# Patient Record
Sex: Male | Born: 1965
Health system: Southern US, Community
[De-identification: ages and names within clinical notes are randomized; demographics above are authoritative.]

## PROBLEM LIST (undated history)

## (undated) DIAGNOSIS — W3400XA Accidental discharge from unspecified firearms or gun, initial encounter: Secondary | ICD-10-CM

## (undated) HISTORY — PX: NO PAST SURGERIES: SHX2092

---

## 2001-09-15 ENCOUNTER — Encounter: Payer: Self-pay | Admitting: Emergency Medicine

## 2001-09-15 ENCOUNTER — Emergency Department (HOSPITAL_COMMUNITY): Admission: EM | Admit: 2001-09-15 | Discharge: 2001-09-15 | Payer: Self-pay | Admitting: Emergency Medicine

## 2001-09-17 ENCOUNTER — Emergency Department (HOSPITAL_COMMUNITY): Admission: EM | Admit: 2001-09-17 | Discharge: 2001-09-17 | Payer: Self-pay | Admitting: Emergency Medicine

## 2001-10-29 ENCOUNTER — Emergency Department (HOSPITAL_COMMUNITY): Admission: EM | Admit: 2001-10-29 | Discharge: 2001-10-29 | Payer: Self-pay | Admitting: Internal Medicine

## 2001-10-29 ENCOUNTER — Encounter: Payer: Self-pay | Admitting: Internal Medicine

## 2011-02-12 ENCOUNTER — Other Ambulatory Visit: Payer: Self-pay

## 2011-02-12 ENCOUNTER — Observation Stay (HOSPITAL_COMMUNITY)
Admission: EM | Admit: 2011-02-12 | Discharge: 2011-02-13 | Payer: Self-pay | Attending: General Surgery | Admitting: General Surgery

## 2011-02-12 ENCOUNTER — Emergency Department (HOSPITAL_COMMUNITY): Payer: Self-pay

## 2011-02-12 ENCOUNTER — Encounter: Payer: Self-pay | Admitting: Emergency Medicine

## 2011-02-12 DIAGNOSIS — S21239A Puncture wound without foreign body of unspecified back wall of thorax without penetration into thoracic cavity, initial encounter: Secondary | ICD-10-CM

## 2011-02-12 DIAGNOSIS — S41012A Laceration without foreign body of left shoulder, initial encounter: Secondary | ICD-10-CM | POA: Diagnosis present

## 2011-02-12 DIAGNOSIS — S42101A Fracture of unspecified part of scapula, right shoulder, initial encounter for closed fracture: Secondary | ICD-10-CM | POA: Diagnosis present

## 2011-02-12 DIAGNOSIS — Y998 Other external cause status: Secondary | ICD-10-CM | POA: Insufficient documentation

## 2011-02-12 DIAGNOSIS — S21219A Laceration without foreign body of unspecified back wall of thorax without penetration into thoracic cavity, initial encounter: Secondary | ICD-10-CM

## 2011-02-12 DIAGNOSIS — S21209A Unspecified open wound of unspecified back wall of thorax without penetration into thoracic cavity, initial encounter: Secondary | ICD-10-CM | POA: Insufficient documentation

## 2011-02-12 DIAGNOSIS — S2190XA Unspecified open wound of unspecified part of thorax, initial encounter: Principal | ICD-10-CM | POA: Insufficient documentation

## 2011-02-12 DIAGNOSIS — S2232XA Fracture of one rib, left side, initial encounter for closed fracture: Secondary | ICD-10-CM | POA: Diagnosis present

## 2011-02-12 DIAGNOSIS — S42199B Fracture of other part of scapula, unspecified shoulder, initial encounter for open fracture: Secondary | ICD-10-CM | POA: Insufficient documentation

## 2011-02-12 DIAGNOSIS — S61412A Laceration without foreign body of left hand, initial encounter: Secondary | ICD-10-CM | POA: Diagnosis present

## 2011-02-12 DIAGNOSIS — S0180XA Unspecified open wound of other part of head, initial encounter: Secondary | ICD-10-CM | POA: Insufficient documentation

## 2011-02-12 DIAGNOSIS — F1010 Alcohol abuse, uncomplicated: Secondary | ICD-10-CM | POA: Diagnosis present

## 2011-02-12 DIAGNOSIS — F17200 Nicotine dependence, unspecified, uncomplicated: Secondary | ICD-10-CM | POA: Insufficient documentation

## 2011-02-12 DIAGNOSIS — S61409A Unspecified open wound of unspecified hand, initial encounter: Secondary | ICD-10-CM | POA: Insufficient documentation

## 2011-02-12 DIAGNOSIS — Z1811 Retained magnetic metal fragments: Secondary | ICD-10-CM | POA: Insufficient documentation

## 2011-02-12 DIAGNOSIS — S42102A Fracture of unspecified part of scapula, left shoulder, initial encounter for closed fracture: Secondary | ICD-10-CM

## 2011-02-12 DIAGNOSIS — W3400XA Accidental discharge from unspecified firearms or gun, initial encounter: Secondary | ICD-10-CM | POA: Insufficient documentation

## 2011-02-12 DIAGNOSIS — S42109A Fracture of unspecified part of scapula, unspecified shoulder, initial encounter for closed fracture: Secondary | ICD-10-CM

## 2011-02-12 DIAGNOSIS — S21109A Unspecified open wound of unspecified front wall of thorax without penetration into thoracic cavity, initial encounter: Secondary | ICD-10-CM

## 2011-02-12 DIAGNOSIS — S270XXA Traumatic pneumothorax, initial encounter: Secondary | ICD-10-CM

## 2011-02-12 DIAGNOSIS — S2239XB Fracture of one rib, unspecified side, initial encounter for open fracture: Secondary | ICD-10-CM | POA: Insufficient documentation

## 2011-02-12 DIAGNOSIS — S21119A Laceration without foreign body of unspecified front wall of thorax without penetration into thoracic cavity, initial encounter: Secondary | ICD-10-CM | POA: Diagnosis present

## 2011-02-12 HISTORY — DX: Accidental discharge from unspecified firearms or gun, initial encounter: W34.00XA

## 2011-02-12 LAB — POCT I-STAT, CHEM 8
BUN: 15 mg/dL (ref 6–23)
Calcium, Ion: 1.11 mmol/L — ABNORMAL LOW (ref 1.12–1.32)
Chloride: 104 mEq/L (ref 96–112)
Creatinine, Ser: 1.4 mg/dL — ABNORMAL HIGH (ref 0.50–1.35)
Glucose, Bld: 78 mg/dL (ref 70–99)
HCT: 44 % (ref 39.0–52.0)
Potassium: 3.4 mEq/L — ABNORMAL LOW (ref 3.5–5.1)

## 2011-02-12 LAB — COMPREHENSIVE METABOLIC PANEL
AST: 27 U/L (ref 0–37)
Albumin: 4.3 g/dL (ref 3.5–5.2)
BUN: 15 mg/dL (ref 6–23)
CO2: 19 mEq/L (ref 19–32)
Calcium: 9.7 mg/dL (ref 8.4–10.5)
Chloride: 96 mEq/L (ref 96–112)
Creatinine, Ser: 1.21 mg/dL (ref 0.50–1.35)
GFR calc non Af Amer: 71 mL/min — ABNORMAL LOW (ref >90)
Total Bilirubin: 0.3 mg/dL (ref 0.3–1.2)

## 2011-02-12 LAB — URINALYSIS, MICROSCOPIC ONLY
Glucose, UA: NEGATIVE mg/dL
Leukocytes, UA: NEGATIVE
Nitrite: NEGATIVE
Specific Gravity, Urine: 1.024 (ref 1.005–1.030)
pH: 5.5 (ref 5.0–8.0)

## 2011-02-12 LAB — LACTIC ACID, PLASMA: Lactic Acid, Venous: 8.6 mmol/L — ABNORMAL HIGH (ref 0.5–2.2)

## 2011-02-12 LAB — CBC
Hemoglobin: 13.9 g/dL (ref 13.0–17.0)
MCH: 33.9 pg (ref 26.0–34.0)
Platelets: 201 10*3/uL (ref 150–400)
RBC: 4.1 MIL/uL — ABNORMAL LOW (ref 4.22–5.81)
WBC: 10 10*3/uL (ref 4.0–10.5)

## 2011-02-12 MED ORDER — TETANUS-DIPHTH-ACELL PERTUSSIS 5-2.5-18.5 LF-MCG/0.5 IM SUSP
0.5000 mL | Freq: Once | INTRAMUSCULAR | Status: AC
  Administered 2011-02-12: 0.5 mL via INTRAMUSCULAR

## 2011-02-12 MED ORDER — MORPHINE SULFATE 2 MG/ML IJ SOLN: 2.0000 mg | INTRAMUSCULAR | Status: DC | PRN

## 2011-02-12 MED ORDER — HYDROCODONE-ACETAMINOPHEN 5-325 MG PO TABS
2.0000 | ORAL_TABLET | ORAL | Status: DC | PRN
  Administered 2011-02-12 – 2011-02-13 (×5): 2 via ORAL
  Filled 2011-02-12 (×5): qty 2

## 2011-02-12 MED ORDER — KCL IN DEXTROSE-NACL 10-5-0.45 MEQ/L-%-% IV SOLN
INTRAVENOUS | Status: DC
  Administered 2011-02-12: 20 mL/h via INTRAVENOUS
  Filled 2011-02-12 (×2): qty 1000

## 2011-02-12 MED ORDER — PANTOPRAZOLE SODIUM 40 MG IV SOLR
40.0000 mg | Freq: Every day | INTRAVENOUS | Status: DC
  Filled 2011-02-12: qty 40

## 2011-02-12 MED ORDER — CEFAZOLIN SODIUM 1-5 GM-% IV SOLN
1.0000 g | Freq: Three times a day (TID) | INTRAVENOUS | Status: DC
  Administered 2011-02-12 – 2011-02-13 (×3): 1 g via INTRAVENOUS
  Filled 2011-02-12 (×5): qty 50

## 2011-02-12 MED ORDER — DOCUSATE SODIUM 100 MG PO CAPS
100.0000 mg | ORAL_CAPSULE | Freq: Two times a day (BID) | ORAL | Status: DC
  Administered 2011-02-12 – 2011-02-13 (×2): 100 mg via ORAL
  Filled 2011-02-12 (×2): qty 1

## 2011-02-12 MED ORDER — PANTOPRAZOLE SODIUM 40 MG PO TBEC
40.0000 mg | DELAYED_RELEASE_TABLET | Freq: Every day | ORAL | Status: DC
  Administered 2011-02-12: 40 mg via ORAL

## 2011-02-12 MED ORDER — IOHEXOL 300 MG/ML  SOLN
100.0000 mL | Freq: Once | INTRAMUSCULAR | Status: AC | PRN
  Administered 2011-02-12: 100 mL via INTRAVENOUS

## 2011-02-12 MED ORDER — ONDANSETRON HCL 4 MG/2ML IJ SOLN: 4.0000 mg | Freq: Four times a day (QID) | INTRAMUSCULAR | Status: DC | PRN

## 2011-02-12 MED ORDER — ENOXAPARIN SODIUM 40 MG/0.4ML ~~LOC~~ SOLN
40.0000 mg | Freq: Every day | SUBCUTANEOUS | Status: DC
  Administered 2011-02-12: 40 mg via SUBCUTANEOUS
  Filled 2011-02-12 (×2): qty 0.4

## 2011-02-12 MED ORDER — ONDANSETRON HCL 4 MG PO TABS: 4.0000 mg | ORAL_TABLET | Freq: Four times a day (QID) | ORAL | Status: DC | PRN
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
## 2011-02-12 NOTE — ED Notes (Signed)
Vital signs stable. 

## 2011-02-12 NOTE — ED Notes (Signed)
Pt to ct scan with RN monitored
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
## 2011-02-12 NOTE — ED Notes (Signed)
See trauma

## 2011-02-12 NOTE — ED Notes (Signed)
Patient transported to CT 
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
## 2011-02-13 ENCOUNTER — Inpatient Hospital Stay (HOSPITAL_COMMUNITY): Payer: Self-pay

## 2011-02-13 DIAGNOSIS — S21239A Puncture wound without foreign body of unspecified back wall of thorax without penetration into thoracic cavity, initial encounter: Secondary | ICD-10-CM

## 2011-02-13 DIAGNOSIS — S42101A Fracture of unspecified part of scapula, right shoulder, initial encounter for closed fracture: Secondary | ICD-10-CM | POA: Diagnosis present

## 2011-02-13 DIAGNOSIS — S41012A Laceration without foreign body of left shoulder, initial encounter: Secondary | ICD-10-CM | POA: Diagnosis present

## 2011-02-13 DIAGNOSIS — Z7289 Other problems related to lifestyle: Secondary | ICD-10-CM | POA: Insufficient documentation

## 2011-02-13 DIAGNOSIS — S61412A Laceration without foreign body of left hand, initial encounter: Secondary | ICD-10-CM | POA: Diagnosis present

## 2011-02-13 DIAGNOSIS — S21119A Laceration without foreign body of unspecified front wall of thorax without penetration into thoracic cavity, initial encounter: Secondary | ICD-10-CM | POA: Diagnosis present

## 2011-02-13 LAB — TYPE AND SCREEN
ABO/RH(D): A POS
Antibody Screen: NEGATIVE
Unit division: 0
Unit division: 0

## 2011-02-13 LAB — CBC
HCT: 39 % (ref 39.0–52.0)
MCH: 33.2 pg (ref 26.0–34.0)
MCHC: 34.9 g/dL (ref 30.0–36.0)
MCV: 95.1 fL (ref 78.0–100.0)
RDW: 11.9 % (ref 11.5–15.5)

## 2011-02-13 LAB — SAMPLE TO BLOOD BANK

## 2011-02-13 LAB — BASIC METABOLIC PANEL
BUN: 13 mg/dL (ref 6–23)
Creatinine, Ser: 1.02 mg/dL (ref 0.50–1.35)
GFR calc Af Amer: >90 mL/min (ref >90)
GFR calc non Af Amer: 88 mL/min — ABNORMAL LOW (ref >90)

## 2011-02-13 MED ORDER — HYDROCODONE-ACETAMINOPHEN 5-325 MG PO TABS: 2.0000 | ORAL_TABLET | ORAL | Status: AC | PRN
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
## 2011-02-13 NOTE — Progress Notes (Signed)
Feeling well Patient examined and I agree with the assessment and plan  Dajanae Brophy E
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
## 2011-02-14 NOTE — Discharge Summary (Signed)
Agree Oscar Jackson  

## 2012-12-18 ENCOUNTER — Encounter (HOSPITAL_COMMUNITY): Payer: Self-pay | Admitting: Emergency Medicine

## 2012-12-18 ENCOUNTER — Emergency Department (HOSPITAL_COMMUNITY)
Admission: EM | Admit: 2012-12-18 | Discharge: 2012-12-18 | Disposition: A | Discharge status: Home / Self Care | Payer: Self-pay | Attending: Emergency Medicine | Admitting: Emergency Medicine

## 2012-12-18 ENCOUNTER — Emergency Department (HOSPITAL_COMMUNITY): Payer: Self-pay

## 2012-12-18 DIAGNOSIS — S022XXA Fracture of nasal bones, initial encounter for closed fracture: Secondary | ICD-10-CM

## 2012-12-18 DIAGNOSIS — F17200 Nicotine dependence, unspecified, uncomplicated: Secondary | ICD-10-CM | POA: Insufficient documentation

## 2012-12-18 DIAGNOSIS — S0003XA Contusion of scalp, initial encounter: Secondary | ICD-10-CM | POA: Insufficient documentation

## 2012-12-18 DIAGNOSIS — S0083XA Contusion of other part of head, initial encounter: Secondary | ICD-10-CM

## 2012-12-18 DIAGNOSIS — S032XXA Dislocation of tooth, initial encounter: Secondary | ICD-10-CM

## 2012-12-18 DIAGNOSIS — S025XXA Fracture of tooth (traumatic), initial encounter for closed fracture: Secondary | ICD-10-CM | POA: Insufficient documentation

## 2012-12-18 MED ORDER — HYDROCODONE-ACETAMINOPHEN 5-325 MG PO TABS: 2.0000 | ORAL_TABLET | Freq: Four times a day (QID) | ORAL | Status: DC | PRN
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
## 2013-11-07 ENCOUNTER — Emergency Department (HOSPITAL_COMMUNITY): Payer: Self-pay

## 2013-11-07 ENCOUNTER — Emergency Department (HOSPITAL_COMMUNITY)
Admission: EM | Admit: 2013-11-07 | Discharge: 2013-11-07 | Disposition: A | Discharge status: Home / Self Care | Payer: Self-pay | Attending: Emergency Medicine | Admitting: Emergency Medicine

## 2013-11-07 ENCOUNTER — Encounter (HOSPITAL_COMMUNITY): Payer: Self-pay | Admitting: Emergency Medicine

## 2013-11-07 DIAGNOSIS — Y9289 Other specified places as the place of occurrence of the external cause: Secondary | ICD-10-CM | POA: Insufficient documentation

## 2013-11-07 DIAGNOSIS — S62639B Displaced fracture of distal phalanx of unspecified finger, initial encounter for open fracture: Secondary | ICD-10-CM | POA: Insufficient documentation

## 2013-11-07 DIAGNOSIS — Y9389 Activity, other specified: Secondary | ICD-10-CM | POA: Insufficient documentation

## 2013-11-07 DIAGNOSIS — Z792 Long term (current) use of antibiotics: Secondary | ICD-10-CM | POA: Insufficient documentation

## 2013-11-07 DIAGNOSIS — Z87828 Personal history of other (healed) physical injury and trauma: Secondary | ICD-10-CM | POA: Insufficient documentation

## 2013-11-07 DIAGNOSIS — S6980XA Other specified injuries of unspecified wrist, hand and finger(s), initial encounter: Secondary | ICD-10-CM | POA: Insufficient documentation

## 2013-11-07 DIAGNOSIS — W208XXA Other cause of strike by thrown, projected or falling object, initial encounter: Secondary | ICD-10-CM | POA: Insufficient documentation

## 2013-11-07 DIAGNOSIS — F17200 Nicotine dependence, unspecified, uncomplicated: Secondary | ICD-10-CM | POA: Insufficient documentation

## 2013-11-07 DIAGNOSIS — S62635B Displaced fracture of distal phalanx of left ring finger, initial encounter for open fracture: Secondary | ICD-10-CM

## 2013-11-07 DIAGNOSIS — IMO0001 Reserved for inherently not codable concepts without codable children: Secondary | ICD-10-CM

## 2013-11-07 DIAGNOSIS — S62639A Displaced fracture of distal phalanx of unspecified finger, initial encounter for closed fracture: Secondary | ICD-10-CM | POA: Insufficient documentation

## 2013-11-07 DIAGNOSIS — S62637A Displaced fracture of distal phalanx of left little finger, initial encounter for closed fracture: Secondary | ICD-10-CM

## 2013-11-07 DIAGNOSIS — S6990XA Unspecified injury of unspecified wrist, hand and finger(s), initial encounter: Secondary | ICD-10-CM | POA: Insufficient documentation

## 2013-11-07 MED ORDER — CEFAZOLIN SODIUM-DEXTROSE 2-3 GM-% IV SOLR
2.0000 g | Freq: Once | INTRAVENOUS | Status: AC
  Administered 2013-11-07: 2 g via INTRAVENOUS
  Filled 2013-11-07: qty 50

## 2013-11-07 MED ORDER — CEPHALEXIN 500 MG PO CAPS: 500.0000 mg | ORAL_CAPSULE | Freq: Four times a day (QID) | ORAL | Status: DC

## 2013-11-07 MED ORDER — LIDOCAINE HCL (PF) 1 % IJ SOLN
INTRAMUSCULAR | Status: AC
  Filled 2013-11-07: qty 5

## 2013-11-07 MED ORDER — OXYCODONE-ACETAMINOPHEN 5-325 MG PO TABS: 1.0000 | ORAL_TABLET | Freq: Four times a day (QID) | ORAL | Status: DC | PRN

## 2013-11-07 MED ORDER — LIDOCAINE HCL (CARDIAC) 20 MG/ML IV SOLN
INTRAVENOUS | Status: AC
  Filled 2013-11-07: qty 5

## 2013-11-07 MED ORDER — OXYCODONE-ACETAMINOPHEN 5-325 MG PO TABS
1.0000 | ORAL_TABLET | Freq: Once | ORAL | Status: AC
  Administered 2013-11-07: 1 via ORAL
  Filled 2013-11-07: qty 1

## 2013-11-07 MED ORDER — LIDOCAINE HCL (PF) 2 % IJ SOLN
10.0000 mL | INTRAMUSCULAR | Status: AC
  Administered 2013-11-07: 10 mL
  Filled 2013-11-07 (×2): qty 10

## 2013-11-07 MED ORDER — BUPIVACAINE HCL (PF) 0.5 % IJ SOLN
10.0000 mL | INTRAMUSCULAR | Status: AC
  Administered 2013-11-07: 10 mL
  Filled 2013-11-07 (×2): qty 10

## 2013-11-07 MED ORDER — OXYCODONE-ACETAMINOPHEN 5-325 MG PO TABS
2.0000 | ORAL_TABLET | Freq: Once | ORAL | Status: AC
  Administered 2013-11-07: 2 via ORAL
  Filled 2013-11-07: qty 2

## 2013-11-07 NOTE — Discharge Instructions (Signed)
Take percocet for severe pain only. No driving or operating heavy machinery while taking percocet. This medication may cause drowsiness. Take antibiotic to completion. Follow up with the hand surgeon Dr. Amanda Pea in 1 week.  Finger Fracture Fractures of fingers are breaks in the bones of the fingers. There are many types of fractures. There are different ways of treating these fractures. Your health care provider will discuss the best way to treat your fracture. CAUSES Traumatic injury is the main cause of broken fingers. These include:  Injuries while playing sports.  Workplace injuries.  Falls. RISK FACTORS Activities that can increase your risk of finger fractures include:  Sports.  Workplace activities that involve machinery.  A condition called osteoporosis, which can make your bones less dense and cause them to fracture more easily. SIGNS AND SYMPTOMS The main symptoms of a broken finger are pain and swelling within 15 minutes after the injury. Other symptoms include:  Bruising of your finger.  Stiffness of your finger.  Numbness of your finger.  Exposed bones (compound fracture) if the fracture is severe. DIAGNOSIS  The best way to diagnose a broken bone is with X-ray imaging. Additionally, your health care provider will use this X-ray image to evaluate the position of the broken finger bones.  TREATMENT  Finger fractures can be treated with:   Nonreduction--This means the bones are in place. The finger is splinted without changing the positions of the bone pieces. The splint is usually left on for about a week to 10 days. This will depend on your fracture and what your health care provider thinks.  Closed reduction--The bones are put back into position without using surgery. The finger is then splinted.  Open reduction and internal fixation--The fracture site is opened. Then the bone pieces are fixed into place with pins or some type of hardware. This is seldom  required. It depends on the severity of the fracture. HOME CARE INSTRUCTIONS   Follow your health care provider's instructions regarding activities, exercises, and physical therapy.  Only take over-the-counter or prescription medicines for pain, discomfort, or fever as directed by your health care provider. SEEK MEDICAL CARE IF: You have pain or swelling that limits the motion or use of your fingers. SEEK IMMEDIATE MEDICAL CARE IF:  Your finger becomes numb. MAKE SURE YOU:   Understand these instructions.  Will watch your condition.  Will get help right away if you are not doing well or get worse. Document Released: 05/24/2000 Document Revised: 11/30/2012 Document Reviewed: 09/21/2012 [facility] Patient Information 2015 [place], [place]. This information is not intended to replace advice given to you by your health care provider. Make sure you discuss any questions you have with your health care provider.
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
## 2013-11-07 NOTE — ED Notes (Signed)
Pt states he is unable to afford medicine-- case management notified

## 2013-11-07 NOTE — Progress Notes (Signed)
  CARE MANAGEMENT ED NOTE 11/07/2013  Patient:  Oscar Jackson, Oscar Jackson   Account Number:  [id number]  Date Initiated:  11/07/2013  Documentation initiated by:  Ferdinand Cava  Subjective/Objective Assessment:   48 yo male presenting to the ED with laceration on hand     Subjective/Objective Assessment Detail:     Action/Plan:   The patient will follow up at either [facility] or [facility] ( provided information) and will fill prescriptions at [facility]   Action/Plan Detail:   Anticipated DC Date:       Status Recommendation to Physician:   Result of Recommendation:  Agreed    DC Planning Services  CM consult  PCP issues    Choice offered to / List presented to:  C-1 Patient          Status of service:  Completed, signed off  ED Comments:   ED Comments Detail:  CM consulted for medication assistance. This CM spoke with the patient regarding current ED visit. The patient stated that he does not have any insurance. He stated that he has been using the [facility] resources but he has just recently moved into a group home. This CM provided the patient with a pamphlet for the [facility] and explained that since he has housing he may not be eligible for [facility] services and the patient verbalized understanding. This CM discussed the $4 medication list at [facility]. The patient stated that he has no money today but he can afford the medications tomorrow. This CM spoke with the ED PA Robyn and she stated that she would prescribe medications from the [facility] $4 list for affordability. This Cm spoke with the patient and informed him of the cost of the discharge medications. The patient verbalized understanding and had no other questions or concerns.
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
## 2013-11-11 NOTE — ED Provider Notes (Signed)
Medical screening examination/treatment/procedure(s) were conducted as a shared visit with non-physician practitioner(s) and myself.  I personally evaluated the patient during the encounter.   EKG Interpretation None       Patient with finger fractures and lacerations. Repaired by hand, will d/c with pain meds and abx.  Audree Camel, MD 11/11/13 [phone]

## 2014-11-22 ENCOUNTER — Encounter (HOSPITAL_COMMUNITY): Payer: Self-pay | Admitting: Emergency Medicine

## 2014-11-22 ENCOUNTER — Emergency Department (HOSPITAL_COMMUNITY)
Admission: EM | Admit: 2014-11-22 | Discharge: 2014-11-22 | Disposition: A | Discharge status: Home / Self Care | Payer: No Typology Code available for payment source | Attending: Emergency Medicine | Admitting: Emergency Medicine

## 2014-11-22 DIAGNOSIS — S199XXA Unspecified injury of neck, initial encounter: Secondary | ICD-10-CM | POA: Diagnosis present

## 2014-11-22 DIAGNOSIS — Z720 Tobacco use: Secondary | ICD-10-CM | POA: Diagnosis not present

## 2014-11-22 DIAGNOSIS — Y998 Other external cause status: Secondary | ICD-10-CM | POA: Insufficient documentation

## 2014-11-22 DIAGNOSIS — Y9389 Activity, other specified: Secondary | ICD-10-CM | POA: Insufficient documentation

## 2014-11-22 DIAGNOSIS — Y92410 Unspecified street and highway as the place of occurrence of the external cause: Secondary | ICD-10-CM | POA: Insufficient documentation

## 2014-11-22 DIAGNOSIS — Z792 Long term (current) use of antibiotics: Secondary | ICD-10-CM | POA: Insufficient documentation

## 2014-11-22 DIAGNOSIS — G629 Polyneuropathy, unspecified: Secondary | ICD-10-CM | POA: Insufficient documentation

## 2014-11-22 DIAGNOSIS — M542 Cervicalgia: Secondary | ICD-10-CM

## 2014-11-22 MED ORDER — CYCLOBENZAPRINE HCL 10 MG PO TABS: 10.0000 mg | ORAL_TABLET | Freq: Two times a day (BID) | ORAL | Status: DC | PRN
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
## 2014-11-22 NOTE — Discharge Instructions (Signed)
Please monitor for new or worsening signs or symptoms, return immediately if any present. Please use medication as directed.

## 2014-11-22 NOTE — ED Notes (Signed)
Pt acknowledges taking all belongings home. 

## 2014-12-06 ENCOUNTER — Emergency Department (HOSPITAL_COMMUNITY)
Admission: EM | Admit: 2014-12-06 | Discharge: 2014-12-06 | Disposition: A | Discharge status: Home / Self Care | Payer: No Typology Code available for payment source | Attending: Emergency Medicine | Admitting: Emergency Medicine

## 2014-12-06 ENCOUNTER — Emergency Department (HOSPITAL_COMMUNITY): Payer: No Typology Code available for payment source

## 2014-12-06 ENCOUNTER — Encounter (HOSPITAL_COMMUNITY): Payer: Self-pay | Admitting: Emergency Medicine

## 2014-12-06 DIAGNOSIS — S4992XA Unspecified injury of left shoulder and upper arm, initial encounter: Secondary | ICD-10-CM | POA: Insufficient documentation

## 2014-12-06 DIAGNOSIS — M25512 Pain in left shoulder: Secondary | ICD-10-CM

## 2014-12-06 DIAGNOSIS — Z792 Long term (current) use of antibiotics: Secondary | ICD-10-CM | POA: Diagnosis not present

## 2014-12-06 DIAGNOSIS — Y92410 Unspecified street and highway as the place of occurrence of the external cause: Secondary | ICD-10-CM | POA: Diagnosis not present

## 2014-12-06 DIAGNOSIS — Z720 Tobacco use: Secondary | ICD-10-CM | POA: Insufficient documentation

## 2014-12-06 DIAGNOSIS — Y9389 Activity, other specified: Secondary | ICD-10-CM | POA: Insufficient documentation

## 2014-12-06 DIAGNOSIS — Y998 Other external cause status: Secondary | ICD-10-CM | POA: Diagnosis not present

## 2014-12-06 MED ORDER — IBUPROFEN 600 MG PO TABS: 600.0000 mg | ORAL_TABLET | Freq: Four times a day (QID) | ORAL | Status: DC | PRN

## 2014-12-06 MED ORDER — KETOROLAC TROMETHAMINE 60 MG/2ML IM SOLN
60.0000 mg | Freq: Once | INTRAMUSCULAR | Status: AC
  Administered 2014-12-06: 60 mg via INTRAMUSCULAR
  Filled 2014-12-06: qty 2
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
## 2014-12-06 NOTE — Discharge Instructions (Signed)
You were evaluated for your left shoulder pain. There is not appear to be an emergent cause for your symptoms at this time. Your x-ray was negative for any broken bones. Your exam is reassuring. Please take your medications as prescribed. Follow-up with your doctor for reevaluation as needed. Return to ED for worsening symptoms  Musculoskeletal Pain Musculoskeletal pain is muscle and boney aches and pains. These pains can occur in any part of the body. Your caregiver may treat you without knowing the cause of the pain. They may treat you if blood or urine tests, X-rays, and other tests were normal.  CAUSES There is often not a definite cause or reason for these pains. These pains may be caused by a type of germ (virus). The discomfort may also come from overuse. Overuse includes working out too hard when your body is not fit. Boney aches also come from weather changes. Bone is sensitive to atmospheric pressure changes. HOME CARE INSTRUCTIONS   Ask when your test results will be ready. Make sure you get your test results.  Only take over-the-counter or prescription medicines for pain, discomfort, or fever as directed by your caregiver. If you were given medications for your condition, do not drive, operate machinery or power tools, or sign legal documents for 24 hours. Do not drink alcohol. Do not take sleeping pills or other medications that may interfere with treatment.  Continue all activities unless the activities cause more pain. When the pain lessens, slowly resume normal activities. Gradually increase the intensity and duration of the activities or exercise.  During periods of severe pain, bed rest may be helpful. Lay or sit in any position that is comfortable.  Putting ice on the injured area.  Put ice in a bag.  Place a towel between your skin and the bag.  Leave the ice on for 15 to 20 minutes, 3 to 4 times a day.  Follow up with your caregiver for continued problems and no reason  can be found for the pain. If the pain becomes worse or does not go away, it may be necessary to repeat tests or do additional testing. Your caregiver may need to look further for a possible cause. SEEK IMMEDIATE MEDICAL CARE IF:  You have pain that is getting worse and is not relieved by medications.  You develop chest pain that is associated with shortness or breath, sweating, feeling sick to your stomach (nauseous), or throw up (vomit).  Your pain becomes localized to the abdomen.  You develop any new symptoms that seem different or that concern you. MAKE SURE YOU:   Understand these instructions.  Will watch your condition.  Will get help right away if you are not doing well or get worse.   This information is not intended to replace advice given to you by your health care provider. Make sure you discuss any questions you have with your health care provider.   Document Released: 02/09/2005 Document Revised: 05/04/2011 Document Reviewed: 10/14/2012 Elsevier Interactive Patient Education 2016 [employer]  Shoulder Pain The shoulder is the joint that connects your arms to your body. The bones that form the shoulder joint include the upper arm bone (humerus), the shoulder blade (scapula), and the collarbone (clavicle). The top of the humerus is shaped like a ball and fits into a rather flat socket on the scapula (glenoid cavity). A combination of muscles and strong, fibrous tissues that connect muscles to bones (tendons) support your shoulder joint and hold the ball in the  socket. Small, fluid-filled sacs (bursae) are located in different areas of the joint. They act as cushions between the bones and the overlying soft tissues and help reduce friction between the gliding tendons and the bone as you move your arm. Your shoulder joint allows a wide range of motion in your arm. This range of motion allows you to do things like scratch your back or throw a ball. However, this range of motion  also makes your shoulder more prone to pain from overuse and injury. Causes of shoulder pain can originate from both injury and overuse and usually can be grouped in the following four categories:  Redness, swelling, and pain (inflammation) of the tendon (tendinitis) or the bursae (bursitis).  Instability, such as a dislocation of the joint.  Inflammation of the joint (arthritis).  Broken bone (fracture). HOME CARE INSTRUCTIONS   Apply ice to the sore area.  Put ice in a plastic bag.  Place a towel between your skin and the bag.  Leave the ice on for 15-20 minutes, 3-4 times per day for the first 2 days, or as directed by your health care provider.  Stop using cold packs if they do not help with the pain.  If you have a shoulder sling or immobilizer, wear it as long as your caregiver instructs. Only remove it to shower or bathe. Move your arm as little as possible, but keep your hand moving to prevent swelling.  Squeeze a soft ball or foam pad as much as possible to help prevent swelling.  Only take over-the-counter or prescription medicines for pain, discomfort, or fever as directed by your caregiver. SEEK MEDICAL CARE IF:   Your shoulder pain increases, or new pain develops in your arm, hand, or fingers.  Your hand or fingers become cold and numb.  Your pain is not relieved with medicines. SEEK IMMEDIATE MEDICAL CARE IF:   Your arm, hand, or fingers are numb or tingling.  Your arm, hand, or fingers are significantly swollen or turn white or blue. MAKE SURE YOU:   Understand these instructions.  Will watch your condition.  Will get help right away if you are not doing well or get worse.   This information is not intended to replace advice given to you by your health care provider. Make sure you discuss any questions you have with your health care provider.   Document Released: 11/19/2004 Document Revised: 03/02/2014 Document Reviewed: 06/04/2014 Elsevier Interactive  Patient Education [employer]

## 2014-12-06 NOTE — ED Notes (Signed)
Declined W/C at D/C and was escorted to lobby by RN. 

## 2014-12-22 ENCOUNTER — Emergency Department (HOSPITAL_COMMUNITY): Payer: Self-pay

## 2014-12-22 ENCOUNTER — Emergency Department (HOSPITAL_COMMUNITY)
Admission: EM | Admit: 2014-12-22 | Discharge: 2014-12-22 | Disposition: A | Discharge status: Home / Self Care | Payer: Self-pay | Attending: Emergency Medicine | Admitting: Emergency Medicine

## 2014-12-22 ENCOUNTER — Encounter (HOSPITAL_COMMUNITY): Payer: Self-pay

## 2014-12-22 DIAGNOSIS — S0083XA Contusion of other part of head, initial encounter: Secondary | ICD-10-CM | POA: Insufficient documentation

## 2014-12-22 DIAGNOSIS — S025XXA Fracture of tooth (traumatic), initial encounter for closed fracture: Secondary | ICD-10-CM | POA: Insufficient documentation

## 2014-12-22 DIAGNOSIS — Y998 Other external cause status: Secondary | ICD-10-CM | POA: Insufficient documentation

## 2014-12-22 DIAGNOSIS — Z720 Tobacco use: Secondary | ICD-10-CM | POA: Insufficient documentation

## 2014-12-22 DIAGNOSIS — Y9389 Activity, other specified: Secondary | ICD-10-CM | POA: Insufficient documentation

## 2014-12-22 DIAGNOSIS — S0031XA Abrasion of nose, initial encounter: Secondary | ICD-10-CM | POA: Insufficient documentation

## 2014-12-22 DIAGNOSIS — S0990XA Unspecified injury of head, initial encounter: Secondary | ICD-10-CM | POA: Insufficient documentation

## 2014-12-22 DIAGNOSIS — Y92410 Unspecified street and highway as the place of occurrence of the external cause: Secondary | ICD-10-CM | POA: Insufficient documentation

## 2014-12-22 DIAGNOSIS — S59902A Unspecified injury of left elbow, initial encounter: Secondary | ICD-10-CM | POA: Insufficient documentation

## 2014-12-22 MED ORDER — HYDROMORPHONE HCL 1 MG/ML IJ SOLN
1.0000 mg | Freq: Once | INTRAMUSCULAR | Status: DC
  Filled 2014-12-22: qty 1

## 2014-12-22 MED ORDER — HYDROCODONE-ACETAMINOPHEN 5-325 MG PO TABS
1.0000 | ORAL_TABLET | Freq: Once | ORAL | Status: AC
  Administered 2014-12-22: 1 via ORAL
  Filled 2014-12-22: qty 1

## 2014-12-22 MED ORDER — HYDROCODONE-ACETAMINOPHEN 5-325 MG PO TABS
1.0000 | ORAL_TABLET | Freq: Four times a day (QID) | ORAL | Status: DC | PRN
Start: 2014-12-22 — End: 2019-05-11
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
## 2014-12-22 NOTE — ED Notes (Signed)
Pt called wife, family updated as to patient's status.

## 2014-12-22 NOTE — ED Notes (Signed)
Pt sitting at end of bed, family at bedside adamant that he is going home.  Dr. Jeraldine LootsLockwood notified.

## 2014-12-22 NOTE — ED Notes (Signed)
Pt leaving AMA, family at bedside, Dr Jeraldine LootsLockwood aware

## 2014-12-24 ENCOUNTER — Encounter (HOSPITAL_COMMUNITY): Payer: Self-pay | Admitting: Emergency Medicine

## 2014-12-26 ENCOUNTER — Encounter (HOSPITAL_COMMUNITY): Payer: Self-pay | Admitting: Emergency Medicine

## 2014-12-26 ENCOUNTER — Emergency Department (HOSPITAL_COMMUNITY): Payer: Self-pay

## 2014-12-26 ENCOUNTER — Emergency Department (HOSPITAL_COMMUNITY)
Admission: EM | Admit: 2014-12-26 | Discharge: 2014-12-26 | Disposition: A | Discharge status: Home / Self Care | Payer: Self-pay | Attending: Emergency Medicine | Admitting: Emergency Medicine

## 2014-12-26 DIAGNOSIS — S20312D Abrasion of left front wall of thorax, subsequent encounter: Secondary | ICD-10-CM | POA: Insufficient documentation

## 2014-12-26 DIAGNOSIS — T07XXXA Unspecified multiple injuries, initial encounter: Secondary | ICD-10-CM

## 2014-12-26 DIAGNOSIS — Z792 Long term (current) use of antibiotics: Secondary | ICD-10-CM | POA: Insufficient documentation

## 2014-12-26 DIAGNOSIS — S4292XD Fracture of left shoulder girdle, part unspecified, subsequent encounter for fracture with routine healing: Secondary | ICD-10-CM

## 2014-12-26 DIAGNOSIS — S42102D Fracture of unspecified part of scapula, left shoulder, subsequent encounter for fracture with routine healing: Secondary | ICD-10-CM | POA: Insufficient documentation

## 2014-12-26 DIAGNOSIS — S0081XD Abrasion of other part of head, subsequent encounter: Secondary | ICD-10-CM | POA: Insufficient documentation

## 2014-12-26 DIAGNOSIS — S50319D Abrasion of unspecified elbow, subsequent encounter: Secondary | ICD-10-CM | POA: Insufficient documentation

## 2014-12-26 DIAGNOSIS — Z720 Tobacco use: Secondary | ICD-10-CM | POA: Insufficient documentation

## 2014-12-26 MED ORDER — IBUPROFEN 800 MG PO TABS
800.0000 mg | ORAL_TABLET | Freq: Once | ORAL | Status: AC
  Administered 2014-12-26: 800 mg via ORAL
  Filled 2014-12-26: qty 1

## 2014-12-26 MED ORDER — OXYCODONE-ACETAMINOPHEN 5-325 MG PO TABS
1.0000 | ORAL_TABLET | Freq: Once | ORAL | Status: AC
  Administered 2014-12-26: 1 via ORAL
  Filled 2014-12-26: qty 1

## 2014-12-26 MED ORDER — DIAZEPAM 5 MG PO TABS
5.0000 mg | ORAL_TABLET | Freq: Once | ORAL | Status: AC
Start: 2014-12-26 — End: 2014-12-26
  Administered 2014-12-26: 5 mg via ORAL
  Filled 2014-12-26: qty 1

## 2014-12-26 MED ORDER — IBUPROFEN 600 MG PO TABS: 600.0000 mg | ORAL_TABLET | Freq: Four times a day (QID) | ORAL | Status: DC | PRN

## 2014-12-26 MED ORDER — HYDROCODONE-ACETAMINOPHEN 5-325 MG PO TABS: 2.0000 | ORAL_TABLET | ORAL | Status: DC | PRN

## 2014-12-26 NOTE — Discharge Instructions (Signed)
Follow up with orthopedic specialist for evaluation of shoulder fracture. Follow up with opthalmology for evaluation of blurry vision. Keep arm in sling. Apply ice to affected area. Take ibuprofen as needed for pain and inflammation. Be cautious of signs of infections around your skin abrasions. Return to the Emergency department if you experience numbness or tingling in your arm, worsening of your pain, fever, redness or swelling around skin abrasions, vision changes.
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
## 2014-12-26 NOTE — ED Notes (Signed)
Pt upset at discharge regarding d/c pain meds.  Discussed with patient at length with the PA regarding ibuprofen for pain with the vicodin for severe pain not controlled.  Pt upset with amount of vicodin given.  Pt discharged home.
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
## 2015-03-24 ENCOUNTER — Emergency Department (HOSPITAL_COMMUNITY)
Admission: EM | Admit: 2015-03-24 | Discharge: 2015-03-24 | Disposition: A | Discharge status: Home / Self Care | Payer: Self-pay | Attending: Emergency Medicine | Admitting: Emergency Medicine

## 2015-03-24 ENCOUNTER — Encounter (HOSPITAL_COMMUNITY): Payer: Self-pay | Admitting: Family Medicine

## 2015-03-24 DIAGNOSIS — F17210 Nicotine dependence, cigarettes, uncomplicated: Secondary | ICD-10-CM | POA: Insufficient documentation

## 2015-03-24 DIAGNOSIS — Y998 Other external cause status: Secondary | ICD-10-CM | POA: Insufficient documentation

## 2015-03-24 DIAGNOSIS — S0990XA Unspecified injury of head, initial encounter: Secondary | ICD-10-CM

## 2015-03-24 DIAGNOSIS — Y9289 Other specified places as the place of occurrence of the external cause: Secondary | ICD-10-CM | POA: Insufficient documentation

## 2015-03-24 DIAGNOSIS — S0103XA Puncture wound without foreign body of scalp, initial encounter: Secondary | ICD-10-CM

## 2015-03-24 DIAGNOSIS — Y9389 Activity, other specified: Secondary | ICD-10-CM | POA: Insufficient documentation

## 2015-03-24 NOTE — Discharge Instructions (Signed)
Keep the areas clean and dry.  Return here as needed
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
## 2015-03-24 NOTE — ED Notes (Signed)
[employer] at bedside 
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
## 2019-05-11 ENCOUNTER — Encounter (HOSPITAL_COMMUNITY): Payer: Self-pay | Admitting: Emergency Medicine

## 2019-05-11 ENCOUNTER — Emergency Department (HOSPITAL_COMMUNITY)
Admission: EM | Admit: 2019-05-11 | Discharge: 2019-05-11 | Disposition: A | Discharge status: Home / Self Care | Payer: Self-pay | Attending: Emergency Medicine | Admitting: Emergency Medicine

## 2019-05-11 ENCOUNTER — Other Ambulatory Visit: Payer: Self-pay

## 2019-05-11 ENCOUNTER — Emergency Department (HOSPITAL_COMMUNITY): Payer: Self-pay

## 2019-05-11 DIAGNOSIS — W228XXA Striking against or struck by other objects, initial encounter: Secondary | ICD-10-CM | POA: Insufficient documentation

## 2019-05-11 DIAGNOSIS — S52601A Unspecified fracture of lower end of right ulna, initial encounter for closed fracture: Secondary | ICD-10-CM | POA: Insufficient documentation

## 2019-05-11 DIAGNOSIS — F17210 Nicotine dependence, cigarettes, uncomplicated: Secondary | ICD-10-CM | POA: Insufficient documentation

## 2019-05-11 DIAGNOSIS — Y929 Unspecified place or not applicable: Secondary | ICD-10-CM | POA: Insufficient documentation

## 2019-05-11 DIAGNOSIS — Y9389 Activity, other specified: Secondary | ICD-10-CM | POA: Insufficient documentation

## 2019-05-11 DIAGNOSIS — Y999 Unspecified external cause status: Secondary | ICD-10-CM | POA: Insufficient documentation

## 2019-05-11 MED ORDER — IBUPROFEN 600 MG PO TABS: 600.0000 mg | ORAL_TABLET | Freq: Four times a day (QID) | ORAL | 0 refills | Status: AC | PRN

## 2019-05-11 MED ORDER — HYDROCODONE-ACETAMINOPHEN 5-325 MG PO TABS: 1.0000 | ORAL_TABLET | ORAL | 0 refills | Status: AC | PRN
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
## 2019-05-11 NOTE — Discharge Instructions (Signed)
Take Ibuprofen 600mg  for pain and swelling every 6-8 hours Take Norco for severe pain as needed Please follow up with orthopedics to ensure the arm is healing well

## 2019-10-10 ENCOUNTER — Ambulatory Visit: Payer: Self-pay
